# Patient Record
Sex: Female | Born: 2005 | Race: Black or African American | Hispanic: No | Marital: Single | Smoking: Never smoker
Health system: Southern US, Community
[De-identification: ages and names within clinical notes are randomized; demographics above are authoritative.]

## PROBLEM LIST (undated history)

## (undated) DIAGNOSIS — J45909 Unspecified asthma, uncomplicated: Secondary | ICD-10-CM

## (undated) HISTORY — PX: NO PAST SURGERIES: SHX2092

---
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
## 2023-05-03 ENCOUNTER — Telehealth: Payer: Self-pay

## 2023-05-03 NOTE — Telephone Encounter (Signed)
Denying refill advair 115 mcg,ventolin hfa. Patient needs an appointment. Left patient a message to call office to schedule an appointment. Patient needs to keep appointment before meds are called in.

## 2023-05-04 MED ORDER — ALBUTEROL SULFATE HFA 108 (90 BASE) MCG/ACT IN AERS: 1.0000 | INHALATION_SPRAY | Freq: Four times a day (QID) | RESPIRATORY_TRACT | 0 refills | Status: DC | PRN

## 2023-05-04 NOTE — Telephone Encounter (Signed)
Lm for pts mom on dpr to call us back about an appt

## 2023-05-06 ENCOUNTER — Other Ambulatory Visit: Payer: Self-pay | Admitting: *Deleted

## 2023-05-10 ENCOUNTER — Other Ambulatory Visit: Payer: Self-pay

## 2023-05-10 MED ORDER — ALBUTEROL SULFATE HFA 108 (90 BASE) MCG/ACT IN AERS: 1.0000 | INHALATION_SPRAY | Freq: Four times a day (QID) | RESPIRATORY_TRACT | 0 refills | Status: DC | PRN

## 2023-05-17 ENCOUNTER — Ambulatory Visit (INDEPENDENT_AMBULATORY_CARE_PROVIDER_SITE_OTHER): Payer: PRIVATE HEALTH INSURANCE | Admitting: Family

## 2023-05-17 ENCOUNTER — Other Ambulatory Visit: Payer: Self-pay

## 2023-05-17 ENCOUNTER — Encounter: Payer: Self-pay | Admitting: Family

## 2023-05-17 VITALS — BP 114/66 | HR 93 | Temp 97.9°F | Resp 20 | Ht 69.0 in | Wt 288.3 lb

## 2023-05-17 DIAGNOSIS — J4541 Moderate persistent asthma with (acute) exacerbation: Secondary | ICD-10-CM

## 2023-05-17 DIAGNOSIS — J310 Chronic rhinitis: Secondary | ICD-10-CM

## 2023-05-17 MED ORDER — PREDNISONE 10 MG PO TABS: ORAL_TABLET | ORAL | 0 refills | Status: DC

## 2023-05-17 MED ORDER — FLUTICASONE-SALMETEROL 45-21 MCG/ACT IN AERO: INHALATION_SPRAY | RESPIRATORY_TRACT | 5 refills | Status: AC

## 2023-05-17 MED ORDER — ALBUTEROL SULFATE (2.5 MG/3ML) 0.083% IN NEBU: INHALATION_SOLUTION | RESPIRATORY_TRACT | 0 refills | Status: DC

## 2023-05-17 MED ORDER — ALBUTEROL SULFATE HFA 108 (90 BASE) MCG/ACT IN AERS: 1.0000 | INHALATION_SPRAY | Freq: Four times a day (QID) | RESPIRATORY_TRACT | 1 refills | Status: DC | PRN

## 2023-05-17 MED ORDER — MONTELUKAST SODIUM 10 MG PO TABS: 10.0000 mg | ORAL_TABLET | Freq: Every day | ORAL | 5 refills | Status: AC

## 2023-05-17 NOTE — Patient Instructions (Addendum)
 1. Moderate persistent asthma: with acute exacerbation -Start prednisone  10 mg taking 2 tablets twice a day for 3 days, then on the 4th day take 2 tablets in the morning and on the 5th day take one tablet and stop -We can consider decreasing Advair  back to 2 puffs once a day in the Spring/Summer and increasing Advair  to 2 puffs twice a day in Fall/Winter. Mom reports that historically her asthma gets worse in the wonter - Spacer use reviewed. - Daily controller medication(s):   INCREASE Advair  45mcg 2 puffs twice daily  and Singulair  10mg  daily - Prior to physical activity: albuterol  2 puffs 10-15 minutes before physical activity. - Rescue medications: albuterol  4 puffs every 4-6 hours as needed - Asthma control goals:  * Full participation in all desired activities (may need albuterol  before activity) * Albuterol  use two time or less a week on average (not counting use with activity) * Cough interfering with sleep two time or less a month * Oral steroids no more than once a year * No hospitalizations  2. Chronic rhinitis - Continue with montelukast  10mg  daily  - Can add on cetirizine 10mg  as needed for sneezing, itchy, runny nose   - Let us  know if you are interested in getting allergy testing  Follow up: 6-8 weeks or sooner if problems

## 2023-05-17 NOTE — Progress Notes (Signed)
 [address] [place] [address] Dept: [phone]  FOLLOW UP NOTE  Patient ID: Patricia Gay, female    DOB: 10-04-2005  Age: 18 y.o. MRN: [id number] Date of Office Visit: 05/17/2023  Assessment  Chief Complaint: No chief complaint on file.  HPI Patricia Gay is a 18 year old female who presents today for an acute visit of asthma flare. She was last seen on 07/27/22 by Dr. Lorin for moderate persistent asthma and chronic rhinitis. Her mom is here with her today. She denies any new diagnosis or surgeries since her last office visit.  Asthma: She is currently taking Advair  45 mcg 2 puffs once a day and montelukast  10 mg daily. She has been using her albuterol  inhaler once a day and albuterol  via her nebulizer at night. She reports that for the past 2 weeks she has had the sensation of feeling mucous in her lungs, wheezing, tightness in chest, shortness of breath, and nocturnal awakenings due to breathing problems sometimes. She denies cough, fever, chills, and body aches.Since her last office visit she has not required any systemic steroids or made any trips to the emergency room or urgent care due to breathing problems. When she uses her albuterol  inhaler it does help. Her mom reports that her asthma is traditionally worse in the winter.  Chronic rhinitis: she denies rhinorrhea, nasal congestion, and post nasal drip. She has not been treated for any sinus infections since we last saw her. She is currently taking montelukast  10 mg daily and cetirizine as needed.   Drug Allergies:  No Known Allergies  Review of Systems: Negative except as per HPI   Physical Exam: BP 114/66   Pulse 93   Temp 97.9 F (36.6 C) (Temporal)   Resp 20   Ht 5' 9 (1.753 m)   Wt 288 lb 4.8 oz (130.8 kg)   SpO2 99%   BMI 42.57 kg/m    Physical Exam Exam conducted with a chaperone present (mom presesnt).  Constitutional:      Appearance: Normal appearance.  HENT:     Head: Normocephalic and  atraumatic.     Comments: Pharynx normal. Eyes normal. Ears normal. Nose normal    Right Ear: Tympanic membrane, ear canal and external ear normal.     Left Ear: Tympanic membrane, ear canal and external ear normal.     Nose: Nose normal.     Mouth/Throat:     Mouth: Mucous membranes are moist.     Pharynx: Oropharynx is clear.  Eyes:     Conjunctiva/sclera: Conjunctivae normal.  Cardiovascular:     Rate and Rhythm: Regular rhythm.     Heart sounds: Normal heart sounds.  Pulmonary:     Effort: Pulmonary effort is normal.     Breath sounds: Normal breath sounds.     Comments: Lungs clear to auscultation Musculoskeletal:     Cervical back: Neck supple.  Skin:    General: Skin is warm.  Neurological:     Mental Status: She is alert and oriented to person, place, and time.  Psychiatric:        Mood and Affect: Mood normal.        Behavior: Behavior normal.        Thought Content: Thought content normal.        Judgment: Judgment normal.     Diagnostics:  FVC 3.68 L( 96%), FEV1 2.19 L ( 65%), FEV1/FVC 0.60. Spirometry indicates possible moderate obstruction  Assessment and Plan: 1. Chronic  rhinitis   2. Moderate persistent asthma with (acute) exacerbation     No orders of the defined types were placed in this encounter.   Patient Instructions  1. Moderate persistent asthma: with acute exacerbation -Start prednisone  10 mg taking 2 tablets twice a day for 3 days, then on the 4th day take 2 tablets in the morning and on the 5th day take one tablet and stop -We can consider decreasing Advair  back to 2 puffs once a day in the Spring/Summer and increasing Advair  to 2 puffs twice a day in Fall/Winter. Mom reports that historically her asthma gets worse in the wonter - Spacer use reviewed. - Daily controller medication(s):   INCREASE Advair  45mcg 2 puffs twice daily  and Singulair  10mg  daily - Prior to physical activity: albuterol  2 puffs 10-15 minutes before physical activity. -  Rescue medications: albuterol  4 puffs every 4-6 hours as needed - Asthma control goals:  * Full participation in all desired activities (may need albuterol  before activity) * Albuterol  use two time or less a week on average (not counting use with activity) * Cough interfering with sleep two time or less a month * Oral steroids no more than once a year * No hospitalizations  2. Chronic rhinitis - Continue with montelukast  10mg  daily  - Can add on cetirizine 10mg  as needed for sneezing, itchy, runny nose   - Let us  know if you are interested in getting allergy testing  Follow up: 6-8 weeks or sooner if problems      Return in about 6 weeks (around 06/28/2023), or if symptoms worsen or fail to improve.    Thank you for the opportunity to care for this patient.  Please do not hesitate to contact me with questions.  Wanda Craze, FNP [facility] 

## 2023-05-28 ENCOUNTER — Ambulatory Visit: Payer: PRIVATE HEALTH INSURANCE | Admitting: Family

## 2023-06-01 ENCOUNTER — Other Ambulatory Visit: Payer: Self-pay

## 2023-06-01 ENCOUNTER — Encounter (HOSPITAL_BASED_OUTPATIENT_CLINIC_OR_DEPARTMENT_OTHER): Payer: Self-pay | Admitting: Emergency Medicine

## 2023-06-01 ENCOUNTER — Emergency Department (HOSPITAL_BASED_OUTPATIENT_CLINIC_OR_DEPARTMENT_OTHER)
Admission: EM | Admit: 2023-06-01 | Discharge: 2023-06-01 | Disposition: A | Discharge status: Home / Self Care | Payer: PRIVATE HEALTH INSURANCE | Attending: Emergency Medicine | Admitting: Emergency Medicine

## 2023-06-01 ENCOUNTER — Emergency Department (HOSPITAL_BASED_OUTPATIENT_CLINIC_OR_DEPARTMENT_OTHER): Payer: PRIVATE HEALTH INSURANCE

## 2023-06-01 DIAGNOSIS — Z7951 Long term (current) use of inhaled steroids: Secondary | ICD-10-CM | POA: Diagnosis not present

## 2023-06-01 DIAGNOSIS — Z20822 Contact with and (suspected) exposure to covid-19: Secondary | ICD-10-CM | POA: Insufficient documentation

## 2023-06-01 DIAGNOSIS — J45901 Unspecified asthma with (acute) exacerbation: Secondary | ICD-10-CM | POA: Diagnosis present

## 2023-06-01 DIAGNOSIS — R000 Tachycardia, unspecified: Secondary | ICD-10-CM | POA: Diagnosis not present

## 2023-06-01 DIAGNOSIS — J101 Influenza due to other identified influenza virus with other respiratory manifestations: Secondary | ICD-10-CM | POA: Insufficient documentation

## 2023-06-01 DIAGNOSIS — R0602 Shortness of breath: Secondary | ICD-10-CM | POA: Diagnosis present

## 2023-06-01 DIAGNOSIS — J4521 Mild intermittent asthma with (acute) exacerbation: Secondary | ICD-10-CM | POA: Diagnosis not present

## 2023-06-01 LAB — RESP PANEL BY RT-PCR (RSV, FLU A&B, COVID)  RVPGX2
Influenza A by PCR: POSITIVE — AB
Influenza B by PCR: NEGATIVE
Resp Syncytial Virus by PCR: NEGATIVE
SARS Coronavirus 2 by RT PCR: NEGATIVE

## 2023-06-01 LAB — CBC WITH DIFFERENTIAL/PLATELET
Abs Immature Granulocytes: 0.01 10*3/uL (ref 0.00–0.07)
Basophils Absolute: 0 10*3/uL (ref 0.0–0.1)
Basophils Relative: 0 %
Eosinophils Absolute: 0 10*3/uL (ref 0.0–0.5)
Eosinophils Relative: 1 %
HCT: 34.3 % — ABNORMAL LOW (ref 36.0–46.0)
Hemoglobin: 11.2 g/dL — ABNORMAL LOW (ref 12.0–15.0)
Immature Granulocytes: 0 %
Lymphocytes Relative: 7 %
Lymphs Abs: 0.4 10*3/uL — ABNORMAL LOW (ref 0.7–4.0)
MCH: 27.3 pg (ref 26.0–34.0)
MCHC: 32.7 g/dL (ref 30.0–36.0)
MCV: 83.7 fL (ref 80.0–100.0)
Monocytes Absolute: 0.6 10*3/uL (ref 0.1–1.0)
Monocytes Relative: 9 %
Neutro Abs: 4.9 10*3/uL (ref 1.7–7.7)
Neutrophils Relative %: 83 %
Platelets: 258 10*3/uL (ref 150–400)
RBC: 4.1 MIL/uL (ref 3.87–5.11)
RDW: 13.1 % (ref 11.5–15.5)
WBC: 5.9 10*3/uL (ref 4.0–10.5)
nRBC: 0 % (ref 0.0–0.2)

## 2023-06-01 LAB — HCG, SERUM, QUALITATIVE: Preg, Serum: NEGATIVE

## 2023-06-01 LAB — BASIC METABOLIC PANEL
Anion gap: 7 (ref 5–15)
BUN: 11 mg/dL (ref 6–20)
CO2: 22 mmol/L (ref 22–32)
Calcium: 8.2 mg/dL — ABNORMAL LOW (ref 8.9–10.3)
Chloride: 105 mmol/L (ref 98–111)
Creatinine, Ser: 0.74 mg/dL (ref 0.44–1.00)
GFR, Estimated: >60 mL/min (ref >60)
Glucose, Bld: 112 mg/dL — ABNORMAL HIGH (ref 70–99)
Potassium: 3.2 mmol/L — ABNORMAL LOW (ref 3.5–5.1)
Sodium: 134 mmol/L — ABNORMAL LOW (ref 135–145)

## 2023-06-01 MED ORDER — LORATADINE 10 MG PO TABS
10.0000 mg | ORAL_TABLET | Freq: Once | ORAL | Status: AC
  Administered 2023-06-01: 10 mg via ORAL
  Filled 2023-06-01: qty 1

## 2023-06-01 MED ORDER — POTASSIUM CHLORIDE CRYS ER 20 MEQ PO TBCR
40.0000 meq | EXTENDED_RELEASE_TABLET | Freq: Once | ORAL | Status: AC
  Administered 2023-06-01: 40 meq via ORAL
  Filled 2023-06-01: qty 2

## 2023-06-01 MED ORDER — ALBUTEROL SULFATE HFA 108 (90 BASE) MCG/ACT IN AERS: 2.0000 | INHALATION_SPRAY | RESPIRATORY_TRACT | Status: DC | PRN

## 2023-06-01 MED ORDER — IPRATROPIUM-ALBUTEROL 0.5-2.5 (3) MG/3ML IN SOLN
3.0000 mL | Freq: Once | RESPIRATORY_TRACT | Status: AC
  Administered 2023-06-01: 3 mL via RESPIRATORY_TRACT
  Filled 2023-06-01: qty 3

## 2023-06-01 MED ORDER — ALBUTEROL SULFATE (2.5 MG/3ML) 0.083% IN NEBU
5.0000 mg | INHALATION_SOLUTION | Freq: Once | RESPIRATORY_TRACT | Status: AC
  Administered 2023-06-01: 5 mg via RESPIRATORY_TRACT
  Filled 2023-06-01: qty 6

## 2023-06-01 MED ORDER — ALBUTEROL SULFATE (2.5 MG/3ML) 0.083% IN NEBU
2.5000 mg | INHALATION_SOLUTION | Freq: Four times a day (QID) | RESPIRATORY_TRACT | 12 refills | Status: AC | PRN
Start: 2023-06-01 — End: ?

## 2023-06-01 MED ORDER — SODIUM CHLORIDE 0.9 % IV BOLUS
500.0000 mL | Freq: Once | INTRAVENOUS | Status: AC
Start: 2023-06-01 — End: 2023-06-01
  Administered 2023-06-01: 500 mL via INTRAVENOUS

## 2023-06-01 MED ORDER — ALBUTEROL SULFATE (2.5 MG/3ML) 0.083% IN NEBU
2.5000 mg | INHALATION_SOLUTION | Freq: Once | RESPIRATORY_TRACT | Status: AC
  Administered 2023-06-01: 2.5 mg via RESPIRATORY_TRACT
  Filled 2023-06-01: qty 3

## 2023-06-01 MED ORDER — IBUPROFEN 800 MG PO TABS
800.0000 mg | ORAL_TABLET | Freq: Once | ORAL | Status: AC
  Administered 2023-06-01: 800 mg via ORAL
  Filled 2023-06-01: qty 1

## 2023-06-01 MED ORDER — PREDNISONE 20 MG PO TABS
40.0000 mg | ORAL_TABLET | Freq: Once | ORAL | Status: AC
Start: 2023-06-01 — End: 2023-06-01
  Administered 2023-06-01: 40 mg via ORAL
  Filled 2023-06-01: qty 2

## 2023-06-01 MED ORDER — OSELTAMIVIR PHOSPHATE 75 MG PO CAPS: 75.0000 mg | ORAL_CAPSULE | Freq: Two times a day (BID) | ORAL | 0 refills | Status: AC

## 2023-06-01 MED ORDER — SODIUM CHLORIDE 0.9 % IV BOLUS
1000.0000 mL | Freq: Once | INTRAVENOUS | Status: AC
  Administered 2023-06-01: 1000 mL via INTRAVENOUS

## 2023-06-01 MED ORDER — MAGNESIUM SULFATE 2 GM/50ML IV SOLN
2.0000 g | Freq: Once | INTRAVENOUS | Status: AC
  Administered 2023-06-01: 2 g via INTRAVENOUS
  Filled 2023-06-01: qty 50

## 2023-06-01 MED ORDER — PREDNISONE 10 MG PO TABS: 40.0000 mg | ORAL_TABLET | Freq: Every day | ORAL | 0 refills | Status: AC

## 2023-06-01 MED ORDER — ALBUTEROL SULFATE HFA 108 (90 BASE) MCG/ACT IN AERS: 1.0000 | INHALATION_SPRAY | Freq: Four times a day (QID) | RESPIRATORY_TRACT | 0 refills | Status: AC | PRN

## 2023-06-01 NOTE — ED Notes (Signed)
Consult called  for Hospital ist @ 05:55 am

## 2023-06-01 NOTE — Progress Notes (Signed)
Hospitalist Transfer Note:    Nursing staff, Please call [facility] Admits & Consults System-Wide number on Amion [phone]) as soon as patient's arrival, so appropriate admitting provider can evaluate the pt.  Transferring facility: [facility] Requesting provider: Dr. Nicanor Alcon (EDP at [facility]) Reason for transfer: admission for further evaluation and management of acute asthma exacerbation in the setting of influenza A infection.   18 year old female with history of moderate persistent asthma, who presented to [facility] ED complaining of 3 days of shortness of breath associated with wheezing, followed by development of fever over the course the last day.  Patient conveys that she has not received the flu vaccine this season.  Vital signs in the ED were notable for the following: Temperature max 100.4; initial heart rates elevated in the range of 140s to 160s, associate with sinus tachycardia, more recently in the low 100s; systolic blood pressures in the low 100s to 120s; respiratory rate 16-24; no objective hypoxia on RA.  Labs were notable for positive influenza A PCR.  Potassium 3.2.  Imaging notable for chest x-ray shows no evidence of acute cardiopulmonary process  Medications administered prior to transfer included the following: Solumedrol, IV magnesium, nebulizer treatments, supplemental potassium.  Subsequently, I accepted this patient for transfer for inpatient admission to a pcu bed at [facility] or [facility] (first available) for further work-up and management of the above.      Newton Pigg, DO Hospitalist

## 2023-06-01 NOTE — ED Triage Notes (Signed)
Pt reports SOB that started Monday morning.  She started taking home prednisone yesterday.  Took a home breathing treatment 1 hour prior to arrival.    Pt took tylenol about an hour ago.

## 2023-06-01 NOTE — Discharge Instructions (Signed)
We considered admission for asthma exacerbation but you are feeling well and improved.  I have called in some medications to the pharmacy to pick up as soon as you are able.  If you develop worsening shortness of breath, not responding to nebulizer treatments at home, chest pain, other severe symptoms please return to the emergency department immediately for reevaluation.

## 2023-06-01 NOTE — ED Provider Notes (Signed)
[facility] [facility] [place] Provider Note   CSN: [id number] Arrival date & time: 06/01/23  0248     History  Chief Complaint  Patient presents with   Shortness of Breath    Patricia Gay is a 18 y.o. female.  The history is provided by the patient.  Shortness of Breath Severity:  Moderate Onset quality:  Gradual Duration:  1 day Timing:  Constant Progression:  Worsening Chronicity:  Recurrent Context: URI   Relieved by:  Nothing Worsened by:  Nothing Ineffective treatments:  None tried Associated symptoms: fever and wheezing   Risk factors: no recent surgery   Patient with asthma presents with fever and SOB x 1 day.   Past Medical History:  Diagnosis Date   Asthma        Home Medications Prior to Admission medications   Medication Sig Start Date End Date Taking? Authorizing Provider  albuterol (ACCUNEB) 0.63 MG/3ML nebulizer solution Take 3 mLs (0.63 mg total) by nebulization every 6 (six) hours as needed for wheezing or shortness of breath. 03/18/22   Ferol Luz, MD  albuterol (PROVENTIL) (2.5 MG/3ML) 0.083% nebulizer solution Use one ampule in nebulizer every 4-6 hours as needed for asthma flares 05/17/23   Nehemiah Settle, FNP  albuterol (VENTOLIN HFA) 108 (90 Base) MCG/ACT inhaler Inhale 1-2 puffs into the lungs every 6 (six) hours as needed for wheezing or shortness of breath. 05/17/23   Nehemiah Settle, FNP  cetirizine (ZYRTEC) 10 MG tablet Take 10 mg by mouth daily.    [provider]  fluticasone-salmeterol (ADVAIR HFA) [phone] MCG/ACT inhaler Inhale 2 puffs twice a day with spacer. Rinse mouth out after 05/17/23   Nehemiah Settle, FNP  montelukast (SINGULAIR) 10 MG tablet Take 10 mg by mouth at bedtime. Patient not taking: Reported on 07/07/2022    [provider]  montelukast (SINGULAIR) 10 MG tablet Take 1 tablet (10 mg total) by mouth at bedtime. 05/17/23   Nehemiah Settle, FNP  predniSONE (DELTASONE) 10 MG  tablet Take 2 tablets twice a day for 3 days, then on the 4th day take 2 tablets in the morning and on the 5th day take one tablet and stop 05/17/23   Nehemiah Settle, FNP      Allergies    Patient has no known allergies.    Review of Systems   Review of Systems  Constitutional:  Positive for fever.  Respiratory:  Positive for shortness of breath and wheezing.   All other systems reviewed and are negative.   Physical Exam Updated Vital Signs BP 108/63   Pulse (!) 115   Temp 99.5 F (37.5 C) (Oral)   Resp 16   Ht 5\' 9"  (1.753 m)   Wt 130.6 kg   SpO2 97%   BMI 42.53 kg/m  Physical Exam Vitals and nursing note reviewed.  Constitutional:      General: She is not in acute distress.    Appearance: Normal appearance. She is well-developed.  HENT:     Head: Normocephalic and atraumatic.     Nose: Nose normal.  Eyes:     Pupils: Pupils are equal, round, and reactive to light.  Cardiovascular:     Rate and Rhythm: Regular rhythm. Tachycardia present.     Pulses: Normal pulses.     Heart sounds: Normal heart sounds.  Pulmonary:     Effort: Pulmonary effort is normal. Tachypnea present. No respiratory distress.     Breath sounds: Wheezing present.  Abdominal:  General: Bowel sounds are normal. There is no distension.     Palpations: Abdomen is soft.     Tenderness: There is no abdominal tenderness. There is no guarding or rebound.  Musculoskeletal:        General: Normal range of motion.     Cervical back: Neck supple.  Skin:    General: Skin is warm and dry.     Capillary Refill: Capillary refill takes less than 2 seconds.     Findings: No erythema or rash.  Neurological:     General: No focal deficit present.     Mental Status: She is alert and oriented to person, place, and time.     Deep Tendon Reflexes: Reflexes normal.  Psychiatric:        Mood and Affect: Mood normal.     ED Results / Procedures / Treatments   Labs (all labs ordered are listed, but only  abnormal results are displayed) Results for orders placed or performed during the hospital encounter of 06/01/23  Resp panel by RT-PCR (RSV, Flu A&B, Covid) Anterior Nasal Swab   Collection Time: 06/01/23  3:14 AM   Specimen: Anterior Nasal Swab  Result Value Ref Range   SARS Coronavirus 2 by RT PCR NEGATIVE NEGATIVE   Influenza A by PCR POSITIVE (A) NEGATIVE   Influenza B by PCR NEGATIVE NEGATIVE   Resp Syncytial Virus by PCR NEGATIVE NEGATIVE  hCG, serum, qualitative   Collection Time: 06/01/23  5:11 AM  Result Value Ref Range   Preg, Serum NEGATIVE NEGATIVE  CBC with Differential   Collection Time: 06/01/23  5:11 AM  Result Value Ref Range   WBC 5.9 4.0 - 10.5 K/uL   RBC 4.10 3.87 - 5.11 MIL/uL   Hemoglobin 11.2 (L) 12.0 - 15.0 g/dL   HCT 04.5 (L) 40.9 - 81.1 %   MCV 83.7 80.0 - 100.0 fL   MCH 27.3 26.0 - 34.0 pg   MCHC 32.7 30.0 - 36.0 g/dL   RDW 91.4 78.2 - 95.6 %   Platelets 258 150 - 400 K/uL   nRBC 0.0 0.0 - 0.2 %   Neutrophils Relative % 83 %   Neutro Abs 4.9 1.7 - 7.7 K/uL   Lymphocytes Relative 7 %   Lymphs Abs 0.4 (L) 0.7 - 4.0 K/uL   Monocytes Relative 9 %   Monocytes Absolute 0.6 0.1 - 1.0 K/uL   Eosinophils Relative 1 %   Eosinophils Absolute 0.0 0.0 - 0.5 K/uL   Basophils Relative 0 %   Basophils Absolute 0.0 0.0 - 0.1 K/uL   Immature Granulocytes 0 %   Abs Immature Granulocytes 0.01 0.00 - 0.07 K/uL  Basic metabolic panel   Collection Time: 06/01/23  5:11 AM  Result Value Ref Range   Sodium 134 (L) 135 - 145 mmol/L   Potassium 3.2 (L) 3.5 - 5.1 mmol/L   Chloride 105 98 - 111 mmol/L   CO2 22 22 - 32 mmol/L   Glucose, Bld 112 (H) 70 - 99 mg/dL   BUN 11 6 - 20 mg/dL   Creatinine, Ser 2.13 0.44 - 1.00 mg/dL   Calcium 8.2 (L) 8.9 - 10.3 mg/dL   GFR, Estimated >08 >65 mL/min   Anion gap 7 5 - 15   DG Chest 2 View Result Date: 06/01/2023 CLINICAL DATA:  Shortness of breath EXAM: CHEST - 2 VIEW COMPARISON:  01/14/2020 FINDINGS: The heart size and  mediastinal contours are within normal limits. Both lungs are clear. The visualized  skeletal structures are unremarkable. IMPRESSION: No active cardiopulmonary disease. Electronically Signed   By: Alcide Clever M.D.   On: 06/01/2023 03:46     Radiology DG Chest 2 View Result Date: 06/01/2023 CLINICAL DATA:  Shortness of breath EXAM: CHEST - 2 VIEW COMPARISON:  01/14/2020 FINDINGS: The heart size and mediastinal contours are within normal limits. Both lungs are clear. The visualized skeletal structures are unremarkable. IMPRESSION: No active cardiopulmonary disease. Electronically Signed   By: Alcide Clever M.D.   On: 06/01/2023 03:46    Procedures Procedures    Medications Ordered in ED Medications  albuterol (VENTOLIN HFA) 108 (90 Base) MCG/ACT inhaler 2 puff (has no administration in time range)  potassium chloride SA (KLOR-CON M) CR tablet 40 mEq (has no administration in time range)  albuterol (PROVENTIL) (2.5 MG/3ML) 0.083% nebulizer solution 5 mg (has no administration in time range)  ipratropium-albuterol (DUONEB) 0.5-2.5 (3) MG/3ML nebulizer solution 3 mL (3 mLs Nebulization Given 06/01/23 0310)  albuterol (PROVENTIL) (2.5 MG/3ML) 0.083% nebulizer solution 2.5 mg (2.5 mg Nebulization Given 06/01/23 0310)  predniSONE (DELTASONE) tablet 40 mg (40 mg Oral Given 06/01/23 0344)  loratadine (CLARITIN) tablet 10 mg (10 mg Oral Given 06/01/23 0345)  ibuprofen (ADVIL) tablet 800 mg (800 mg Oral Given 06/01/23 0345)  sodium chloride 0.9 % bolus 500 mL (0 mLs Intravenous Stopped 06/01/23 0514)  magnesium sulfate IVPB 2 g 50 mL (2 g Intravenous New Bag/Given 06/01/23 0521)  sodium chloride 0.9 % bolus 1,000 mL (1,000 mLs Intravenous New Bag/Given 06/01/23 1610)    ED Course/ Medical Decision Making/ A&P                                 Medical Decision Making Patient with asthma with fever and wheezing. No flu vaccine this year   Amount and/or Complexity of Data Reviewed Independent Historian:  parent    Details: See above  External Data Reviewed: notes.    Details: Previous notes reviewed  Labs: ordered.    Details: Flu A positive.  White count is normal, hemoglobin low 11.2, normal platelets.  Sodium slight low 134, potassium slight low 3.2, normal creatinine  Radiology: ordered and independent interpretation performed.    Details: Negative CXR by me   Risk OTC drugs. Prescription drug management. Decision regarding hospitalization. Risk Details: Attempted to improve vital signs with magnesium and IV boluses.  Patient is persistently tachycardiac and still requires neb treatments will need admission     Final Clinical Impression(s) / ED Diagnoses Final diagnoses:  Mild intermittent asthma with exacerbation  Influenza A   The patient appears reasonably stabilized for admission considering the current resources, flow, and capabilities available in the ED at this time, and I doubt any other [facility] requiring further screening and/or treatment in the ED prior to admission.  Rx / DC Orders ED Discharge Orders     None         Seba Madole, MD 06/01/23 9604

## 2023-06-01 NOTE — ED Notes (Signed)
Care link called , Consult was repaged @ 06:20 am

## 2023-06-01 NOTE — ED Provider Notes (Signed)
Emergency Medicine Observation Re-evaluation Note  Patricia Gay is a 18 y.o. female, seen on rounds today.  Pt initially presented to the ED for complaints of Shortness of Breath Currently, the patient is awaiting admission for asthma exacerbation with persistent tachycardia.  On reassessment, the patient has had additional IV fluids and nebulizer treatments.  She reports feeling "much better."   Physical Exam  BP (!) 116/56   Pulse (!) 128   Temp 99.5 F (37.5 C) (Oral)   Resp 14   Ht 5\' 9"  (1.753 m)   Wt 130.6 kg   SpO2 96%   BMI 42.53 kg/m  Physical Exam General: Comfortable and resting.  Cardiac: Tachycardia Lungs: Clear in all lung fields. No distress.    ED Course / MDM  EKG:   I have reviewed the labs performed to date as well as medications administered while in observation.  Recent changes in the last 24 hours include patient clinically improved and reports wanting discharge.   Plan  Current plan is for discharge.  Patient is in no distress.  Lungs are clear.  We discussed that she is here waiting for a bed at one of our outside hospitals and that the initial plan was for observation admission.  She would prefer discharge at this point as she is feeling much better.  Mom at bedside states she is looking much more comfortable.  They have a nebulizer machine at home and I will provide refills of this medication along with steroid.  They have rescue MDI as well.  Plan to start Tamiflu.  Discussed strict ED return precautions but at this time discharge seems reasonable.    Maia Plan, MD 06/01/23 [phone]

## 2023-07-16 ENCOUNTER — Ambulatory Visit: Payer: Self-pay | Admitting: Family

## 2023-07-16 DIAGNOSIS — J309 Allergic rhinitis, unspecified: Secondary | ICD-10-CM

## 2023-12-07 ENCOUNTER — Other Ambulatory Visit: Payer: Self-pay

## 2023-12-07 ENCOUNTER — Emergency Department (HOSPITAL_BASED_OUTPATIENT_CLINIC_OR_DEPARTMENT_OTHER)
Admission: EM | Admit: 2023-12-07 | Discharge: 2023-12-07 | Disposition: A | Discharge status: Home / Self Care | Payer: PRIVATE HEALTH INSURANCE | Attending: Emergency Medicine | Admitting: Emergency Medicine

## 2023-12-07 ENCOUNTER — Encounter (HOSPITAL_BASED_OUTPATIENT_CLINIC_OR_DEPARTMENT_OTHER): Payer: Self-pay | Admitting: Emergency Medicine

## 2023-12-07 DIAGNOSIS — M545 Low back pain, unspecified: Secondary | ICD-10-CM | POA: Diagnosis present

## 2023-12-07 DIAGNOSIS — W11XXXA Fall on and from ladder, initial encounter: Secondary | ICD-10-CM | POA: Diagnosis not present

## 2023-12-07 LAB — PREGNANCY, URINE: Preg Test, Ur: NEGATIVE

## 2023-12-07 MED ORDER — NAPROXEN 375 MG PO TABS
375.0000 mg | ORAL_TABLET | Freq: Two times a day (BID) | ORAL | 0 refills | Status: AC
Start: 2023-12-07 — End: ?

## 2023-12-07 MED ORDER — METHOCARBAMOL 500 MG PO TABS
500.0000 mg | ORAL_TABLET | Freq: Two times a day (BID) | ORAL | 0 refills | Status: AC
Start: 2023-12-07 — End: ?

## 2023-12-07 NOTE — Discharge Instructions (Addendum)
 Please refer to the attached instructions. Take medication as directed. You may also take tylenol, 1000 mg, in between naproxen  doses.

## 2023-12-07 NOTE — ED Provider Notes (Signed)
 [facility] Provider Note   CSN: [id number] Arrival date & time: 12/07/23  1311     Patient presents with: Back Pain   Patricia Gay is a 18 y.o. female.   Patient reports falling from bottom step of ladder, landing on buttocks, two days ago. She has since developed low back pain. Pain is worse with movement, bending. No urinary or bowel incontinence. No paresthesias. No sacral or coccyx pain.  The history is provided by the patient.  Back Pain Location:  Lumbar spine Quality:  Stiffness Stiffness is present:  All day Pain severity:  Moderate Pain is:  Same all the time Timing:  Intermittent Progression:  Waxing and waning Chronicity:  New Context: falling   Associated symptoms: no bladder incontinence, no bowel incontinence, no leg pain, no numbness, no paresthesias, no perianal numbness and no weakness        Prior to Admission medications   Medication Sig Start Date End Date Taking? Authorizing Provider  methocarbamol  (ROBAXIN ) 500 MG tablet Take 1 tablet (500 mg total) by mouth 2 (two) times daily. 12/07/23  Yes Claudene Lenis, NP  naproxen  (NAPROSYN ) 375 MG tablet Take 1 tablet (375 mg total) by mouth 2 (two) times daily. 12/07/23  Yes Claudene Lenis, NP  albuterol  (PROVENTIL ) (2.5 MG/3ML) 0.083% nebulizer solution Take 3 mLs (2.5 mg total) by nebulization every 6 (six) hours as needed for wheezing or shortness of breath. 06/01/23   Long, Fonda MATSU, MD  albuterol  (VENTOLIN  HFA) 108 (90 Base) MCG/ACT inhaler Inhale 1-2 puffs into the lungs every 6 (six) hours as needed for wheezing or shortness of breath. 06/01/23   Long, Joshua G, MD  cetirizine (ZYRTEC) 10 MG tablet Take 10 mg by mouth daily.    [provider]  fluticasone -salmeterol (ADVAIR  HFA) 45-21 MCG/ACT inhaler Inhale 2 puffs twice a day with spacer. Rinse mouth out after 05/17/23   Cheryl Reusing, FNP  montelukast  (SINGULAIR ) 10 MG tablet Take 10 mg by mouth at  bedtime. Patient not taking: Reported on 07/07/2022    [provider]  montelukast  (SINGULAIR ) 10 MG tablet Take 1 tablet (10 mg total) by mouth at bedtime. 05/17/23   Cheryl Reusing, FNP  oseltamivir  (TAMIFLU ) 75 MG capsule Take 1 capsule (75 mg total) by mouth every 12 (twelve) hours. 06/01/23   Long, Joshua G, MD    Allergies: Patient has no known allergies.    Review of Systems  Gastrointestinal:  Negative for bowel incontinence.  Genitourinary:  Negative for bladder incontinence.  Musculoskeletal:  Positive for back pain.  Neurological:  Negative for weakness, numbness and paresthesias.  All other systems reviewed and are negative.   Updated Vital Signs BP 132/81 (BP Location: Right Arm)   Pulse 95   Temp 98.5 F (36.9 C)   Resp 15   Ht 5' 9 (1.753 m)   Wt 133.8 kg   LMP 12/03/2023 (Approximate)   SpO2 99%   BMI 43.56 kg/m   Physical Exam HENT:     Head: Atraumatic.     Nose: Nose normal.  Eyes:     Conjunctiva/sclera: Conjunctivae normal.  Cardiovascular:     Rate and Rhythm: Normal rate.  Pulmonary:     Effort: Pulmonary effort is normal.  Musculoskeletal:        General: Normal range of motion.       Back:  Skin:    General: Skin is warm and dry.  Neurological:     Mental Status:  She is alert and oriented to person, place, and time.  Psychiatric:        Mood and Affect: Mood normal.        Behavior: Behavior normal.     (all labs ordered are listed, but only abnormal results are displayed) Labs Reviewed  PREGNANCY, URINE    EKG: None  Radiology: No results found.   Procedures   Medications Ordered in the ED - No data to display                                  Medical Decision Making Amount and/or Complexity of Data Reviewed Labs: ordered.  Risk Prescription drug management.   Patient with back pain.  No neurological deficits and normal neuro exam.  Patient is ambulatory.  No loss of bowel or bladder control.  No concern  for cauda equina.  No fever, night sweats, weight loss, h/o cancer, IVDA, no recent procedure to back. No urinary symptoms suggestive of UTI.  Supportive care and return precaution discussed. Appears safe for discharge at this time. Follow up as indicated in discharge paperwork.       Final diagnoses:  Acute midline low back pain without sciatica    ED Discharge Orders          Ordered    naproxen  (NAPROSYN ) 375 MG tablet  2 times daily        12/07/23 1431    methocarbamol  (ROBAXIN ) 500 MG tablet  2 times daily        12/07/23 1431               Claudene Lenis, NP 12/07/23 1444    Elnor Jayson LABOR, DO 12/13/23 1909

## 2023-12-07 NOTE — ED Triage Notes (Signed)
 Pt reports recent fall at work 2 days ago, now c/o lower back pain. Worse with movement. Denies paresthesia.
